# Patient Record
Sex: Female | Born: 1950 | Race: Black or African American
Health system: Midwestern US, Community
[De-identification: ages and names within clinical notes are randomized; demographics above are authoritative.]

## PROBLEM LIST (undated history)

## (undated) DIAGNOSIS — F209 Schizophrenia, unspecified: Secondary | ICD-10-CM

## (undated) DIAGNOSIS — E119 Type 2 diabetes mellitus without complications: Secondary | ICD-10-CM

---

## 2009-06-07 LAB — SERUM DRUG SCREEN
Acetaminophen Level: <10 ug/mL (ref 10.0–30.0)
Ethanol Lvl: <10 mg/dL
Salicylate Lvl: <1 mg/dL (ref 0–29)
TCA Scrn: NOT DETECTED ng/mL (ref ?–300)

## 2009-06-07 LAB — CBC
Hematocrit: 35.4 % (ref 34.0–48.0)
Hemoglobin: 11.9 g/dL (ref 11.5–15.5)
MCH: 29.7 pg (ref 26.0–35.0)
MCHC: 33.7 % (ref 32.0–34.5)
MCV: 88.2 fL (ref 80.0–99.9)
MPV: 9.2 fL (ref 7.0–12.0)
Platelets: 194 E9/L (ref 130–450)
RBC: 4.01 E12/L (ref 3.50–5.50)
RDW: 12.7 fL (ref 11.5–15.0)
WBC: 6.9 E9/L (ref 4.5–11.5)

## 2009-06-07 LAB — BASIC METABOLIC PANEL
BUN: 10 mg/dL (ref 6–20)
CO2: 32 mmol/L — ABNORMAL HIGH (ref 20–31)
Calcium: 9.4 mg/dL (ref 8.6–10.5)
Chloride: 106 mmol/L (ref 99–109)
Creatinine: 0.8 mg/dL (ref 0.5–1.1)
Glucose: 118 mg/dL — ABNORMAL HIGH (ref 70–110)
Potassium: 3.7 mmol/L (ref 3.5–5.5)
Sodium: 142 mmol/L (ref 132–146)

## 2009-06-07 LAB — GFR CALCULATED: Gfr Calculated: >60 mL/min/{1.73_m2} (ref >=60)

## 2010-06-23 NOTE — ED Notes (Signed)
Pt given bagged lunch.

## 2010-06-23 NOTE — ED Notes (Signed)
ERMD did not want labs to be drawn unless pt was a TDO. I have reviewed discharge instructions with the patient.  The patient verbalized understanding. Respirations unlabored. Skin warm and dry. [facility] Bevely Palmer, Second responder with [employer]. No prescriptions.

## 2010-06-23 NOTE — ED Provider Notes (Signed)
HPI Comments: Pt was brought from the [facility] station for being confused. Was not seen by crisis at that time. Was brought her by police as an ECO, secondary to not answering some questions correctly. Crisis saw her here and decided she has no issues and is not psychotic and is okay       Patient is a 59 y.o. female presenting with mental health disorder. The history is provided by the patient.   Mental Health Problem   This is a recurrent problem. The current episode started less than 1 hour ago. The problem has not changed since onset. Associated symptoms include delusions. Mental status baseline is normal.         No past medical history on file.     No past surgical history on file.      No family history on file.     History   Social History   ??? Marital Status: Unknown     Spouse Name: N/A     Number of Children: N/A   ??? Years of Education: N/A   Occupational History   ??? Not on file.   Social History Main Topics   ??? Smoking status: Current Everyday Smoker   ??? Smokeless tobacco: Not on file   ??? Alcohol Use: Yes   ??? Drug Use: No   ??? Sexually Active:    Other Topics Concern   ??? Not on file   Social History Narrative   ??? No narrative on file                    ALLERGIES: Review of patient's allergies indicates no known allergies.      Review of Systems   All other systems reviewed and are negative.        Filed Vitals:    06/23/10 1709   BP: 129/82   Pulse: 89   Temp: 98.6 ??F (37 ??C)   Resp: 19   Height: 5\' 6"  (1.676 m)   Weight: 120 lb (54.432 kg)   SpO2: 100%              Physical Exam   Nursing note and vitals reviewed.  Constitutional: She is oriented to person, place, and time. She appears well-developed and well-nourished.   HENT:   Head: Normocephalic and atraumatic.   Right Ear: External ear normal.   Left Ear: External ear normal.   Mouth/Throat: Oropharynx is clear and moist. No oropharyngeal exudate.    Eyes: Conjunctivae and EOM are normal. Pupils are equal, round, and reactive to light. Right eye exhibits no discharge. Left eye exhibits no discharge. No scleral icterus.   Neck: Normal range of motion. No tracheal deviation present. No thyromegaly present.   Cardiovascular: Normal rate, regular rhythm and normal heart sounds.    No murmur heard.  Pulmonary/Chest: Effort normal and breath sounds normal. No respiratory distress. She has no wheezes. She has no rales. She exhibits no tenderness.   Abdominal: Soft. She exhibits no distension. No tenderness. She has no rebound and no guarding.   Musculoskeletal: Normal range of motion. She exhibits no edema and no tenderness.   Lymphadenopathy:     She has no cervical adenopathy.   Neurological: She is alert and oriented to person, place, and time. No cranial nerve deficit. Coordination normal.   Skin: Skin is warm. No erythema.   Psychiatric: She has a normal mood and affect. Her speech is normal and behavior is normal. Judgment and thought  content normal. Cognition and memory are normal.        MDM    Procedures

## 2010-06-23 NOTE — ED Notes (Signed)
Crisis here to evaluate pt.

## 2014-05-01 ENCOUNTER — Emergency Department (HOSPITAL_COMMUNITY)
Admission: EM | Admit: 2014-05-01 | Discharge: 2014-05-03 | Disposition: A | Discharge status: Home / Self Care | Payer: Self-pay | Attending: Emergency Medicine | Admitting: Emergency Medicine

## 2014-05-01 ENCOUNTER — Encounter (HOSPITAL_COMMUNITY): Payer: Self-pay | Admitting: Emergency Medicine

## 2014-05-01 DIAGNOSIS — Z720 Tobacco use: Secondary | ICD-10-CM | POA: Insufficient documentation

## 2014-05-01 DIAGNOSIS — F4325 Adjustment disorder with mixed disturbance of emotions and conduct: Secondary | ICD-10-CM | POA: Insufficient documentation

## 2014-05-01 HISTORY — DX: Schizophrenia, unspecified: F20.9

## 2014-05-01 LAB — CBC
HCT: 38.3 % (ref 36.0–46.0)
Hemoglobin: 12 g/dL (ref 12.0–15.0)
MCH: 27.1 pg (ref 26.0–34.0)
MCHC: 31.3 g/dL (ref 30.0–36.0)
MCV: 86.5 fL (ref 78.0–100.0)
PLATELETS: 213 10*3/uL (ref 150–400)
RBC: 4.43 MIL/uL (ref 3.87–5.11)
RDW: 13.9 % (ref 11.5–15.5)
WBC: 8.9 10*3/uL (ref 4.0–10.5)

## 2014-05-01 LAB — COMPREHENSIVE METABOLIC PANEL
ALK PHOS: 76 U/L (ref 39–117)
ALT: 13 U/L (ref 0–35)
AST: 13 U/L (ref 0–37)
Albumin: 3.8 g/dL (ref 3.5–5.2)
Anion gap: 14 (ref 5–15)
BILIRUBIN TOTAL: 0.3 mg/dL (ref 0.3–1.2)
BUN: 14 mg/dL (ref 6–23)
CHLORIDE: 104 meq/L (ref 96–112)
CO2: 27 meq/L (ref 19–32)
Calcium: 9.5 mg/dL (ref 8.4–10.5)
Creatinine, Ser: 0.83 mg/dL (ref 0.50–1.10)
GFR calc Af Amer: 85 mL/min — ABNORMAL LOW (ref >90)
GFR, EST NON AFRICAN AMERICAN: 73 mL/min — AB (ref >90)
Glucose, Bld: 81 mg/dL (ref 70–99)
POTASSIUM: 3.8 meq/L (ref 3.7–5.3)
SODIUM: 145 meq/L (ref 137–147)
Total Protein: 7.3 g/dL (ref 6.0–8.3)

## 2014-05-01 LAB — ETHANOL: Alcohol, Ethyl (B): <11 mg/dL (ref 0–11)

## 2014-05-01 LAB — SALICYLATE LEVEL: Salicylate Lvl: <2 mg/dL — ABNORMAL LOW (ref 2.8–20.0)

## 2014-05-01 LAB — ACETAMINOPHEN LEVEL: Acetaminophen (Tylenol), Serum: <15 ug/mL (ref 10–30)

## 2014-05-01 MED ORDER — STERILE WATER FOR INJECTION IJ SOLN
INTRAMUSCULAR | Status: AC
  Administered 2014-05-01: 21:00:00
  Filled 2014-05-01: qty 10

## 2014-05-01 MED ORDER — ZIPRASIDONE MESYLATE 20 MG IM SOLR
20.0000 mg | Freq: Once | INTRAMUSCULAR | Status: AC
  Administered 2014-05-01: 20 mg via INTRAMUSCULAR
  Filled 2014-05-01: qty 20

## 2014-05-01 NOTE — BH Assessment (Signed)
Reviewed notes prior to assessment. Per notes pt is medically cleared, and behaving bizarrely.   Requested cart be placed with pt for assessment. Per [employer]rystal RN, pt was given geodon and is too sedated for assessment at this time. Crystal RN will fax a copy of IVC paperwork to [phone], and alert TTS when pt is alert enough for assessment.   Clista BernhardtNancy Camrin Lapre, [facility] Triage Specialist 05/01/2014 11:31 PM
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
## 2014-05-01 NOTE — ED Notes (Signed)
Pt verbally and physically abusive towards staff. Pt is yelling and threatening staff, security and [employer] officers. md informed. Charge nurse informed. Please refer to md orders.

## 2014-05-02 ENCOUNTER — Encounter (HOSPITAL_COMMUNITY): Payer: Self-pay | Admitting: *Deleted

## 2014-05-02 LAB — URINALYSIS, ROUTINE W REFLEX MICROSCOPIC
Bilirubin Urine: NEGATIVE
Glucose, UA: NEGATIVE mg/dL
Hgb urine dipstick: NEGATIVE
KETONES UR: NEGATIVE mg/dL
LEUKOCYTES UA: NEGATIVE
Nitrite: NEGATIVE
Protein, ur: NEGATIVE mg/dL
Specific Gravity, Urine: 1.008 (ref 1.005–1.030)
Urobilinogen, UA: 0.2 mg/dL (ref 0.0–1.0)
pH: 5.5 (ref 5.0–8.0)

## 2014-05-02 LAB — RAPID URINE DRUG SCREEN, HOSP PERFORMED
Amphetamines: NOT DETECTED
BARBITURATES: NOT DETECTED
Benzodiazepines: NOT DETECTED
Cocaine: NOT DETECTED
Opiates: NOT DETECTED
Tetrahydrocannabinol: NOT DETECTED

## 2014-05-02 MED ORDER — OLANZAPINE 5 MG PO TBDP: 5.0000 mg | ORAL_TABLET | ORAL | Status: DC

## 2014-05-02 MED ORDER — LORAZEPAM 2 MG/ML IJ SOLN: 2.0000 mg | Freq: Once | INTRAMUSCULAR | Status: AC | PRN

## 2014-05-02 MED ORDER — OLANZAPINE 5 MG PO TBDP: 5.0000 mg | ORAL_TABLET | Freq: Every day | ORAL | Status: DC | PRN

## 2014-05-02 MED ORDER — LORAZEPAM 1 MG PO TABS: 1.0000 mg | ORAL_TABLET | Freq: Once | ORAL | Status: AC | PRN

## 2014-05-02 NOTE — ED Notes (Signed)
Pt talking to someone although there is no one else in the room.
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
## 2014-05-02 NOTE — ED Notes (Signed)
Patient is irritable and anxious.  Reports she is "transiant" moving from [place] then to [place] and is coming her to get the "mega bus'. Refusing medications. Fluids offered. No physical complaints.

## 2014-05-02 NOTE — ED Notes (Signed)
Writer went in to get patient vitals and patient verbally cursed out Clinical research associatewriter and said " i am tired of your ass coming in here getting vitals" I had explained to patient I have not been here all day I had just changed shift with other techs about 2 hours ago. Patient told me to leave her alone. RN Rashell is aware of patient refusing her vitals and the conversation between Clinical research associatewriter and patient.
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
## 2014-05-02 NOTE — ED Notes (Signed)
Pt once again took her lunch tray and threw it in the room and refused to eat anything else.
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
## 2014-05-02 NOTE — ED Notes (Signed)
Pt belongings placed in locker 27 

## 2014-05-02 NOTE — Progress Notes (Signed)
Pt remains under IVC. Patient recommended for inpatient placement.   Byrd HesselbachKristen Zayyan Mullen, LCSW [phone]  ED CSW 05/02/2014 1459pm

## 2014-05-03 ENCOUNTER — Encounter (HOSPITAL_COMMUNITY): Payer: Self-pay | Admitting: Registered Nurse

## 2014-05-03 DIAGNOSIS — F4325 Adjustment disorder with mixed disturbance of emotions and conduct: Secondary | ICD-10-CM

## 2014-05-03 DIAGNOSIS — F29 Unspecified psychosis not due to a substance or known physiological condition: Secondary | ICD-10-CM

## 2014-05-03 NOTE — Consult Note (Signed)
Face to face evaluation and I agree with this note 

## 2014-05-03 NOTE — ED Notes (Signed)
Writer went to take vitals and patient said she does not want anybody to touch her and she told me to get out of her room

## 2014-05-03 NOTE — [facility] Suicide Risk Assessment (Cosign Needed)
Suicide Risk Assessment  Discharge Assessment     Demographic Factors:  Female  Total Time spent with patient: 30 minutes  Psychiatric Specialty Exam:     Blood pressure 118/63, pulse 102, temperature 98.4 F (36.9 C), temperature source Oral, resp. rate 17, SpO2 98.00%.There is no height or weight on file to calculate BMI.   General Appearance: Casual   Eye Contact:: Good   Speech: Clear and Coherent   Volume: Normal   Mood: "I feel fine"   Affect: Appropriate   Thought Process: Coherent   Orientation: Full (Time, Place, and Person)   Thought Content: WDL   Suicidal Thoughts: No   Homicidal Thoughts: No   Memory: Immediate; Good  Recent; Good  Remote; Fair   Judgement: Impaired   Insight: Fair and Present   Psychomotor Activity: Normal   Concentration: Good   Recall: Good   Fund of Knowledge:Fair   Language: Good   Akathisia: No   Handed: Right   AIMS (if indicated):   Assets: Communication Skills  Desire for Improvement   Sleep:   Musculoskeletal:  Strength & Muscle Tone: within normal limits  Gait & Station: wobble  Patient leans: N/A   Mental Status Per Nursing Assessment::   On Admission:     Current Mental Status by Physician: Patient denies suicidal/homicidal ideation, psychosis, and paranoia  Loss Factors: NA  Historical Factors: NA  Risk Reduction Factors:   Religious beliefs about death  Continued Clinical Symptoms:  None noted  Cognitive Features That Contribute To Risk:  None noted    Suicide Risk:  Minimal: No identifiable suicidal ideation.  Patients presenting with no risk factors but with morbid ruminations; may be classified as minimal risk based on the severity of the depressive symptoms  Discharge Diagnoses:   AXIS I:  Adjustment Disorder with Mixed Disturbance of Emotions and Conduct AXIS II:  Deferred AXIS III:   Past Medical History  Diagnosis Date  . Schizophrenia    AXIS IV:  other psychosocial or environmental  problems and problems related to social environment AXIS V:  61-70 mild symptoms  Plan Of Care/Follow-up recommendations:  Activity:  as tolerated Diet:  as tolerated  Is patient on multiple antipsychotic therapies at discharge:  No   Has Patient had three or more failed trials of antipsychotic monotherapy by history:  No  Recommended Plan for Multiple Antipsychotic Therapies: NA    Rankin, Shuvon, FNP-BC 05/03/2014, 10:51 AM

## 2014-05-03 NOTE — ED Notes (Signed)
Patient discharged with bus pass.  She is denying any thoughts of suicide, thoughts of hurting others, or hallucinations.  She states she wants to get to the bus terminal [facility] that [facility].  She left the unit ambulatory and all belongings were returned to her.  She was also given a bus pass.

## 2014-05-03 NOTE — Discharge Instructions (Signed)
Adjustment Disorder °Most changes in life can cause stress. Getting used to changes may take a few months or longer. If feelings of stress, hopelessness, or worry continue, you may have an adjustment disorder. This stress-related mental health problem may affect your feelings, thinking and how you act. It occurs in both sexes and happens at any age. °SYMPTOMS  °Some of the following problems may be seen and vary from person to person: °· Sadness or depression. °· Loss of enjoyment. °· Thoughts of suicide. °· Fighting. °· Avoiding family and friends. °· Poor school performance. °· Hopelessness, sense of loss. °· Trouble sleeping. °· Vandalism. °· Worry, weight loss or gain. °· Crying spells. °· Anxiety °· Reckless driving. °· Skipping school. °· Poor work performance. °· Nervousness. °· Ignoring bills. °· Poor attitude. °DIAGNOSIS  °Your caregiver will ask what has happened in your life and do a physical exam. They will make a diagnosis of an adjustment disorder when they are sure another problem or medical illness causing your feelings does not exist. °TREATMENT  °When problems caused by stress interfere with you daily life or last longer than a few months, you may need counseling for an adjustment disorder. Early treatment may diminish problems and help you to better cope with the stressful events in your life. Sometimes medication is necessary. Individual counseling and or support groups can be very helpful. °PROGNOSIS  °Adjustment disorders usually last less than 3 to 6 months. The condition may persist if there is long lasting stress. This could include health problems, relationship problems, or job difficulties where you can not easily escape from what is causing the problem. °PREVENTION  °Even the most mentally healthy, highly functioning people can suffer from an adjustment disorder given a significant blow from a life-changing event. There is no way to prevent pain and loss. Most people need help from time  to time. You are not alone. °SEEK MEDICAL CARE IF:  °Your feelings or symptoms listed above do not improve or worsen. °Document Released: 03/10/2006 Document Revised: 09/28/2011 Document Reviewed: 06/01/2007 °ExitCare® Patient Information ©2015 [employer]. This information is not intended to replace advice given to you by your health care provider. Make sure you discuss any questions you have with your health care provider. ° °

## 2014-05-03 NOTE — B[facility] Counselor (Signed)
Dr Ladona Ridgelaylor rescinded pt's IVC. Commitment change paperwork placed in [facility] IVC log and copy placed in pt's chart. Pt given bus pass.   Evette Cristalaroline Paige Denilson Salminen, [place] Assessment Counselor
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
## 2014-05-03 NOTE — ED Notes (Signed)
After patient completed shower she came to nurses station and stated " I am Michelle FrayGloria Pennington AKA Michelle GrandchildMelissa Pennington and I am not Michelle Pennington and [place] don't belong [place]". Patient then returned to her room and laid down. Safety maintain.

## 2016-10-24 ENCOUNTER — Emergency Department
Admission: EM | Admit: 2016-10-24 | Discharge: 2016-10-24 | Disposition: A | Discharge status: Home / Self Care | Payer: Medicare Other | Attending: Emergency Medicine | Admitting: Emergency Medicine

## 2016-10-24 ENCOUNTER — Emergency Department: Payer: Medicare Other

## 2016-10-24 ENCOUNTER — Other Ambulatory Visit: Payer: Self-pay

## 2016-10-24 ENCOUNTER — Encounter: Payer: Self-pay | Admitting: Emergency Medicine

## 2016-10-24 DIAGNOSIS — E119 Type 2 diabetes mellitus without complications: Secondary | ICD-10-CM | POA: Insufficient documentation

## 2016-10-24 DIAGNOSIS — F209 Schizophrenia, unspecified: Secondary | ICD-10-CM | POA: Diagnosis not present

## 2016-10-24 DIAGNOSIS — R05 Cough: Secondary | ICD-10-CM | POA: Diagnosis present

## 2016-10-24 DIAGNOSIS — J45901 Unspecified asthma with (acute) exacerbation: Secondary | ICD-10-CM | POA: Diagnosis not present

## 2016-10-24 DIAGNOSIS — Z5181 Encounter for therapeutic drug level monitoring: Secondary | ICD-10-CM | POA: Insufficient documentation

## 2016-10-24 DIAGNOSIS — F17210 Nicotine dependence, cigarettes, uncomplicated: Secondary | ICD-10-CM | POA: Insufficient documentation

## 2016-10-24 HISTORY — DX: Type 2 diabetes mellitus without complications: E11.9

## 2016-10-24 LAB — COMPREHENSIVE METABOLIC PANEL
ALK PHOS: 78 U/L (ref 38–126)
ALT: 18 U/L (ref 14–54)
AST: 20 U/L (ref 15–41)
Albumin: 3.8 g/dL (ref 3.5–5.0)
Anion gap: 6 (ref 5–15)
BILIRUBIN TOTAL: 0.3 mg/dL (ref 0.3–1.2)
BUN: 21 mg/dL — ABNORMAL HIGH (ref 6–20)
CHLORIDE: 108 mmol/L (ref 101–111)
CO2: 29 mmol/L (ref 22–32)
Calcium: 9.2 mg/dL (ref 8.9–10.3)
Creatinine, Ser: 0.88 mg/dL (ref 0.44–1.00)
GFR calc non Af Amer: >60 mL/min (ref >60)
GLUCOSE: 114 mg/dL — AB (ref 65–99)
Potassium: 3.8 mmol/L (ref 3.5–5.1)
Sodium: 143 mmol/L (ref 135–145)
Total Protein: 7.3 g/dL (ref 6.5–8.1)

## 2016-10-24 LAB — CBC
HEMATOCRIT: 38.2 % (ref 35.0–47.0)
Hemoglobin: 12.1 g/dL (ref 12.0–16.0)
MCH: 26.9 pg (ref 26.0–34.0)
MCHC: 31.8 g/dL — ABNORMAL LOW (ref 32.0–36.0)
MCV: 84.8 fL (ref 80.0–100.0)
Platelets: 243 10*3/uL (ref 150–440)
RBC: 4.5 MIL/uL (ref 3.80–5.20)
RDW: 14.5 % (ref 11.5–14.5)
WBC: 7.1 10*3/uL (ref 3.6–11.0)

## 2016-10-24 LAB — GLUCOSE, CAPILLARY: Glucose-Capillary: 149 mg/dL — ABNORMAL HIGH (ref 65–99)

## 2016-10-24 LAB — SALICYLATE LEVEL: Salicylate Lvl: <7 mg/dL (ref 2.8–30.0)

## 2016-10-24 LAB — URINE DRUG SCREEN, QUALITATIVE (ARMC ONLY)
Amphetamines, Ur Screen: NOT DETECTED
BENZODIAZEPINE, UR SCRN: NOT DETECTED
Barbiturates, Ur Screen: NOT DETECTED
CANNABINOID 50 NG, UR ~~LOC~~: NOT DETECTED
Cocaine Metabolite,Ur ~~LOC~~: NOT DETECTED
MDMA (Ecstasy)Ur Screen: NOT DETECTED
Methadone Scn, Ur: NOT DETECTED
Opiate, Ur Screen: NOT DETECTED
Phencyclidine (PCP) Ur S: NOT DETECTED
Tricyclic, Ur Screen: NOT DETECTED

## 2016-10-24 LAB — TROPONIN I: Troponin I: <0.03 ng/mL (ref <0.03)

## 2016-10-24 LAB — ACETAMINOPHEN LEVEL

## 2016-10-24 LAB — ETHANOL: Alcohol, Ethyl (B): <5 mg/dL (ref <5)

## 2016-10-24 MED ORDER — ALBUTEROL SULFATE (2.5 MG/3ML) 0.083% IN NEBU: 3.0000 mL | INHALATION_SOLUTION | RESPIRATORY_TRACT | Status: DC | PRN

## 2016-10-24 MED ORDER — IPRATROPIUM-ALBUTEROL 0.5-2.5 (3) MG/3ML IN SOLN
3.0000 mL | Freq: Once | RESPIRATORY_TRACT | Status: AC
  Administered 2016-10-24: 3 mL via RESPIRATORY_TRACT

## 2016-10-24 MED ORDER — IPRATROPIUM-ALBUTEROL 0.5-2.5 (3) MG/3ML IN SOLN
RESPIRATORY_TRACT | Status: AC
  Administered 2016-10-24: 3 mL via RESPIRATORY_TRACT
  Filled 2016-10-24: qty 6

## 2016-10-24 MED ORDER — METHYLPREDNISOLONE SODIUM SUCC 125 MG IJ SOLR
125.0000 mg | Freq: Once | INTRAMUSCULAR | Status: AC
  Administered 2016-10-24: 125 mg via INTRAVENOUS

## 2016-10-24 MED ORDER — IPRATROPIUM-ALBUTEROL 0.5-2.5 (3) MG/3ML IN SOLN
RESPIRATORY_TRACT | Status: AC
  Administered 2016-10-24: 3 mL via RESPIRATORY_TRACT
  Filled 2016-10-24: qty 3

## 2016-10-24 MED ORDER — METHYLPREDNISOLONE SODIUM SUCC 125 MG IJ SOLR
INTRAMUSCULAR | Status: AC
  Administered 2016-10-24: 125 mg via INTRAVENOUS
  Filled 2016-10-24: qty 2

## 2016-10-24 MED ORDER — PREDNISONE 20 MG PO TABS
ORAL_TABLET | ORAL | Status: AC
  Filled 2016-10-24: qty 2

## 2016-10-24 MED ORDER — ALBUTEROL SULFATE HFA 108 (90 BASE) MCG/ACT IN AERS
2.0000 | INHALATION_SPRAY | RESPIRATORY_TRACT | Status: DC | PRN
  Filled 2016-10-24: qty 6.7

## 2016-10-24 NOTE — ED Notes (Signed)
Taxi Voucher to shelter at [facility] provided to patient.
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
## 2016-10-24 NOTE — ED Notes (Signed)
Roylene Reason RN to provide D/C. (Patient refusing female Charity fundraiser)

## 2016-10-24 NOTE — ED Notes (Signed)
Patient refusing Proventil MDI, states "that stuff is killing people in the hospitals in [place], I wont take it". Roylene Reason RN and this RN attempted to explain risks/benefits of medication. Patient continues to refuse.

## 2016-10-24 NOTE — ED Notes (Signed)
Pt reports feeling better after breathing treatments, wheezing noted to have improved

## 2016-10-24 NOTE — ED Triage Notes (Signed)
Pt to triage via WC, brought in by [employer] from [employer], per [employer] pt is homeless and has been living in [employer] parking lot.  Pt wheezing, stating asthma has worsened over last 2 days.  Pt also reports being depressed b/c she can't find a place to live, denies SI

## 2016-10-24 NOTE — ED Notes (Signed)
Introduced self to patient. Patient states "I dont want a female nurse".  Patient refuses to say anything else to this RN
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
## 2016-10-24 NOTE — ED Notes (Signed)
MD Manson Passey seeing patient.

## 2016-10-24 NOTE — ED Notes (Signed)
VOL/ PENDING SOC CONSULT  

## 2016-10-24 NOTE — ED Notes (Addendum)
Pt refusing proventil because it is not the "gray one." Pt states that they "took that stuff off the market in 1969 in [place] because it was harming people". Pt stated that "it takes all the good stuff out of your body."  This RN attempted to explain to the patient that this was the albuterol that MD had prescribed but just the generic that our hospital carries. Pt still refuses proventil inhaler.

## 2018-01-31 IMAGING — CR DG CHEST 2V
1 series · 2 of 2 positions shown · non-contrast
Comparison: None.

CLINICAL DATA: Wheezing with asthma over the last 2 days.

EXAM:
CHEST  2 VIEW

[Series 1: dg chest 2 view · 0.14mm/px · 2 of 2 slices shown]
[im 1/2]
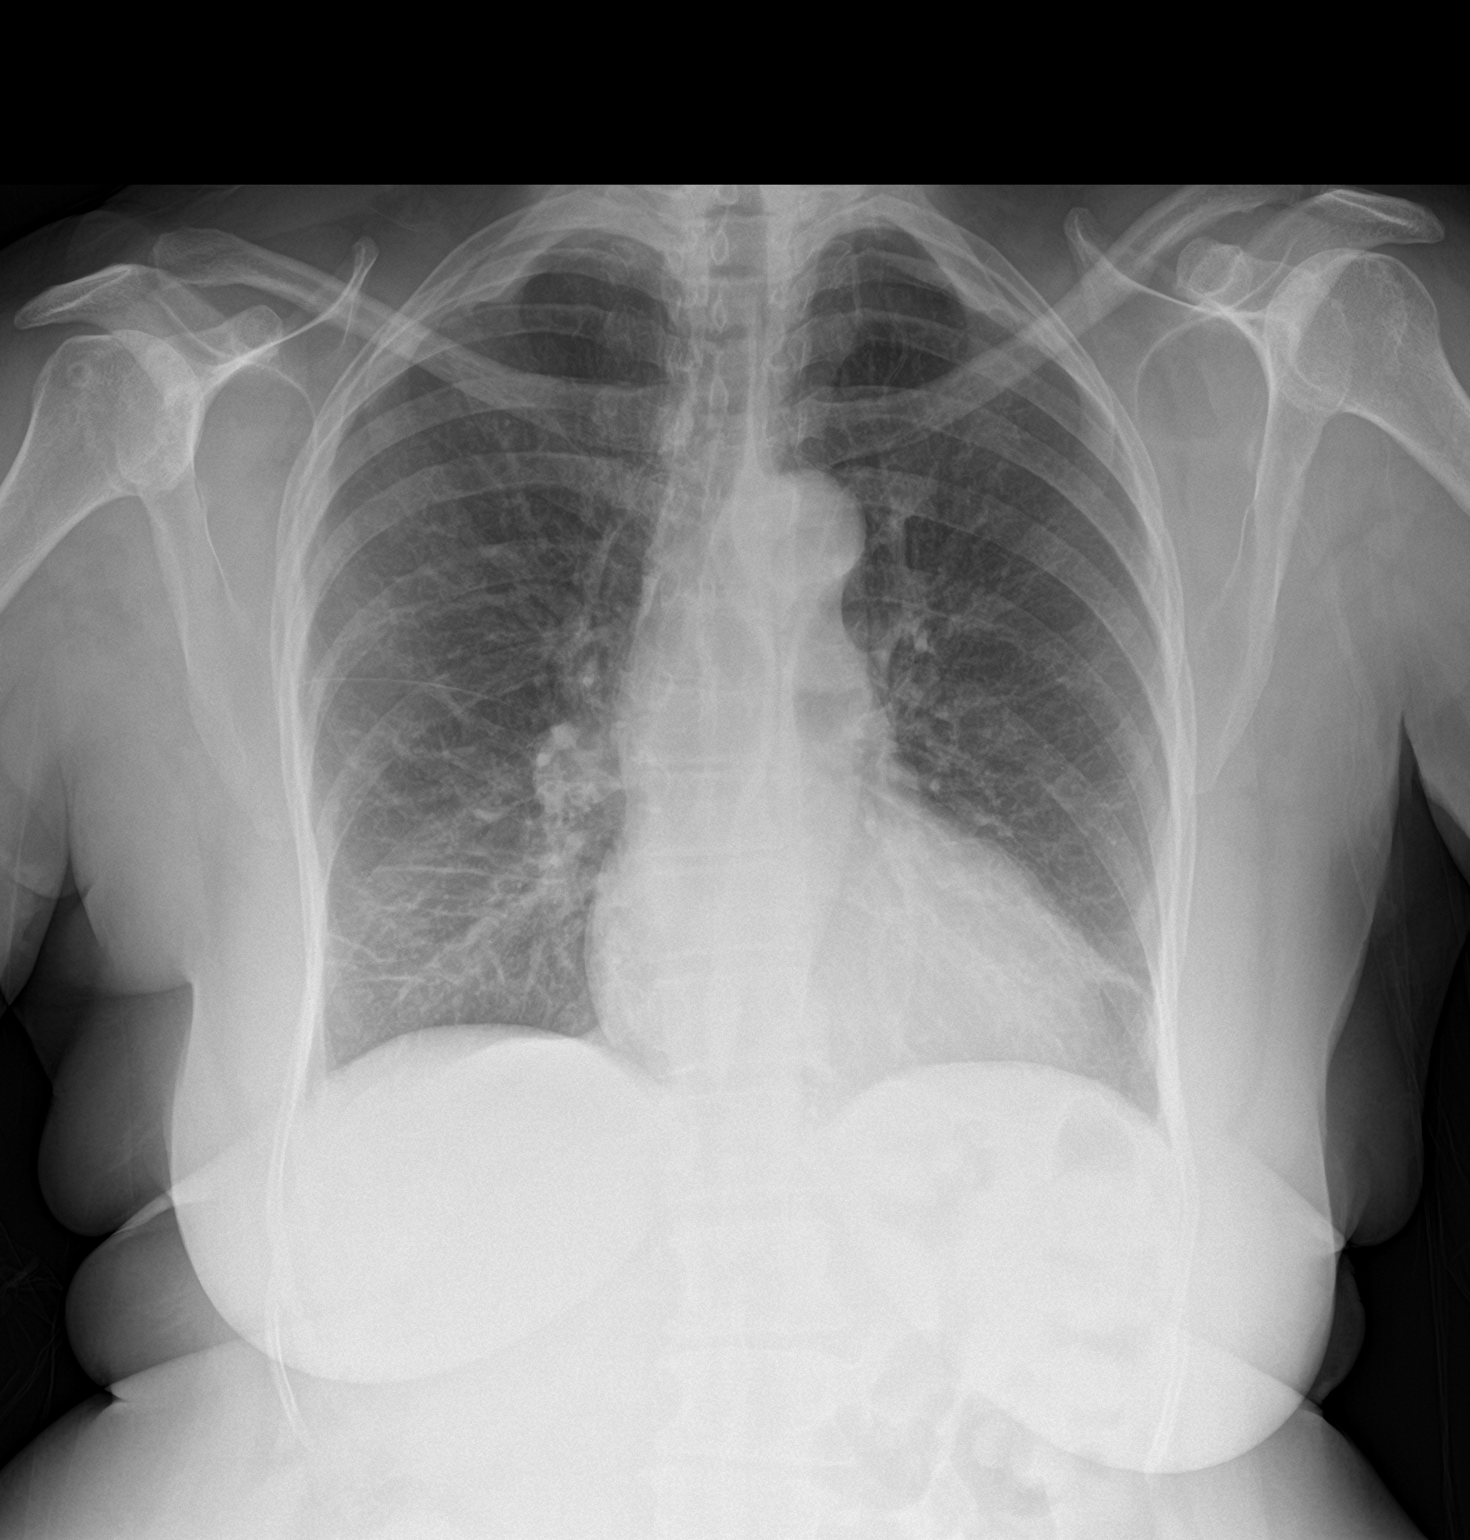
[im 2/2]
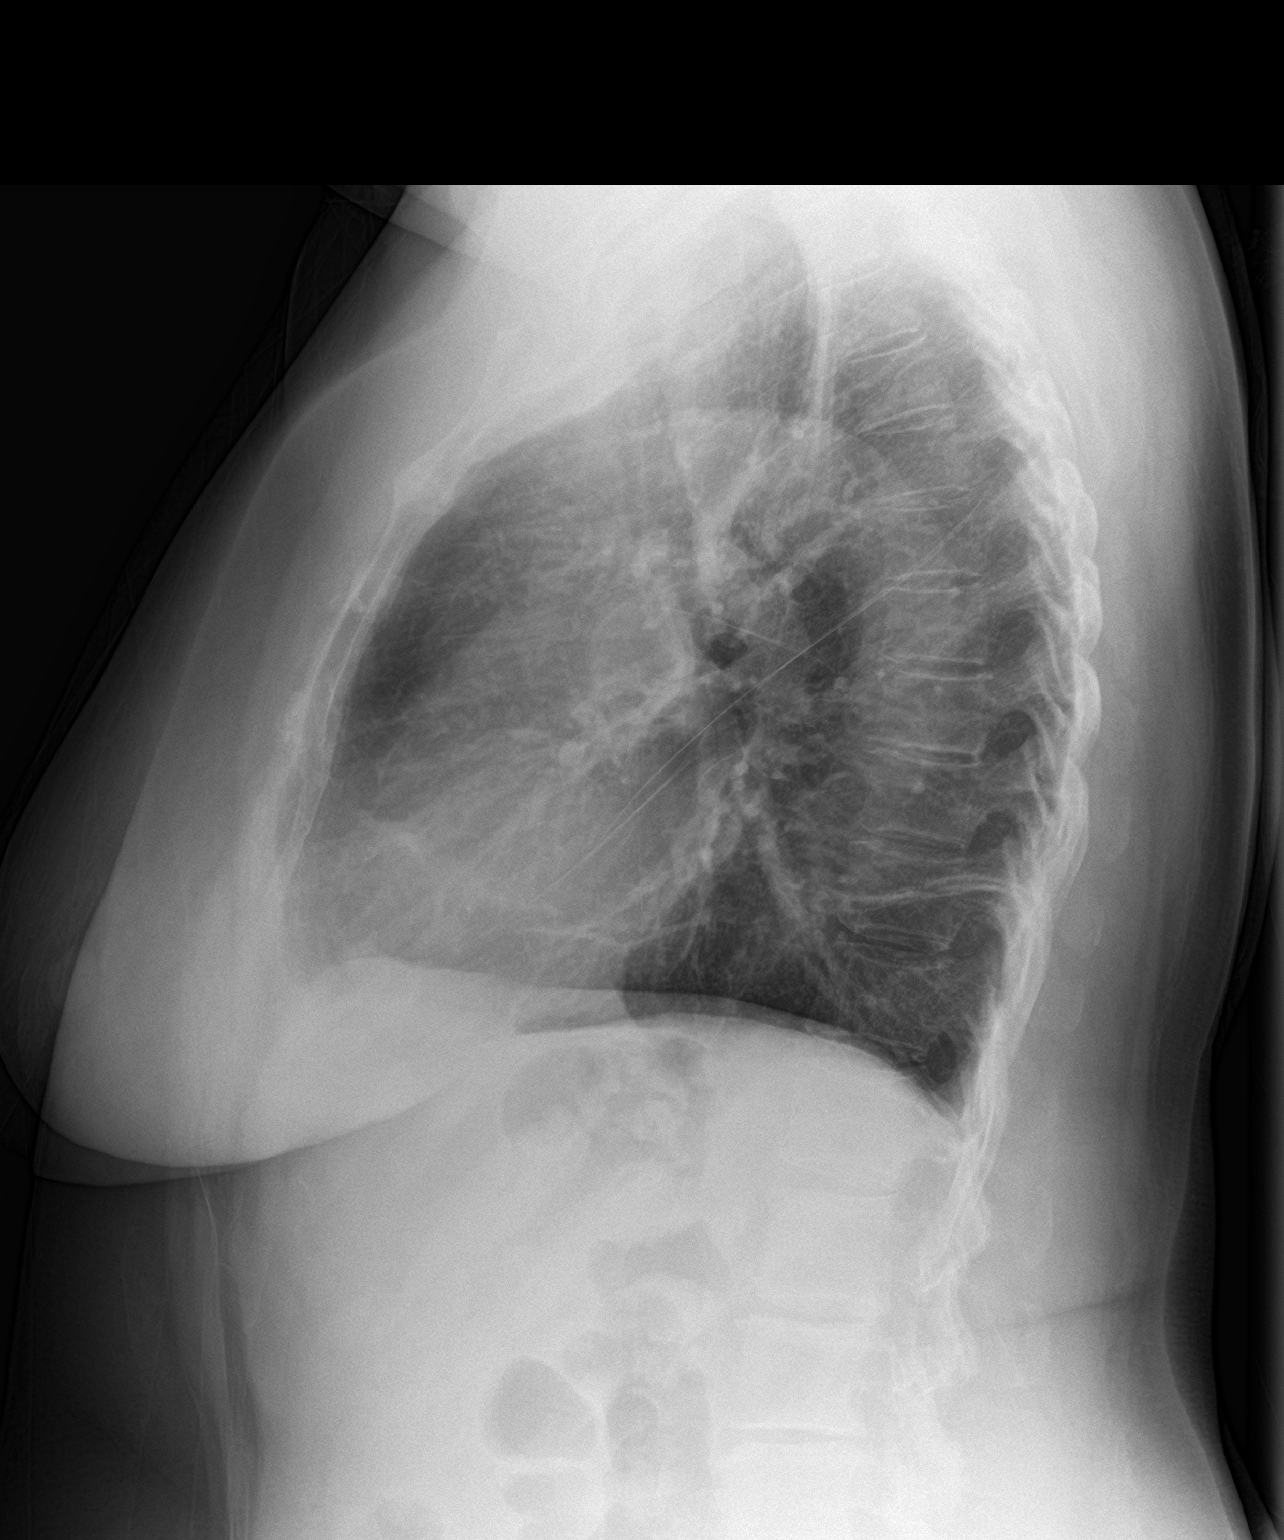

[2 of 2 positions shown; findings below may reference images not displayed]

FINDINGS: The heart size and mediastinal contours are within normal limits.
Mild aortic atherosclerosis without aneurysm. Mild hyperinflation of
the lungs, upper lobe predominant. Bibasilar scarring and/or
atelectasis is noted. The visualized skeletal structures are
unremarkable.
IMPRESSION: Hyperinflated appearance of the lungs, upper lobe predominant.
Bibasilar scarring and/or atelectasis.

## 2020-09-03 ENCOUNTER — Emergency Department: Admit: 2020-09-03 | Payer: BLUE CROSS/BLUE SHIELD

## 2020-09-03 ENCOUNTER — Inpatient Hospital Stay
Admit: 2020-09-03 | Discharge: 2020-09-03 | Disposition: A | Discharge status: Home / Self Care | Payer: BLUE CROSS/BLUE SHIELD | Attending: Emergency Medicine

## 2020-09-03 DIAGNOSIS — R509 Fever, unspecified: Secondary | ICD-10-CM

## 2020-09-03 LAB — CBC WITH AUTO DIFFERENTIAL
Basophils %: 0 % (ref 0–1)
Basophils Absolute: 0 10*3/uL (ref 0.0–0.1)
Eosinophils %: 2 % (ref 0–7)
Eosinophils Absolute: 0.1 10*3/uL (ref 0.0–0.4)
Granulocyte Absolute Count: 0 10*3/uL (ref 0.00–0.04)
Hematocrit: 32.2 % — ABNORMAL LOW (ref 35.0–47.0)
Hemoglobin: 9.8 g/dL — ABNORMAL LOW (ref 11.5–16.0)
Immature Granulocytes: 1 % — ABNORMAL HIGH (ref 0.0–0.5)
Lymphocytes %: 23 % (ref 12–49)
Lymphocytes Absolute: 1.6 10*3/uL (ref 0.8–3.5)
MCH: 27 PG (ref 26.0–34.0)
MCHC: 30.4 g/dL (ref 30.0–36.5)
MCV: 88.7 FL (ref 80.0–99.0)
MPV: 11.2 FL (ref 8.9–12.9)
Monocytes %: 9 % (ref 5–13)
Monocytes Absolute: 0.6 10*3/uL (ref 0.0–1.0)
NRBC Absolute: 0 10*3/uL (ref 0.00–0.01)
Neutrophils %: 65 % (ref 32–75)
Neutrophils Absolute: 4.4 10*3/uL (ref 1.8–8.0)
Nucleated RBCs: 0 PER 100 WBC
Platelets: 204 10*3/uL (ref 150–400)
RBC: 3.63 M/uL — ABNORMAL LOW (ref 3.80–5.20)
RDW: 13.7 % (ref 11.5–14.5)
WBC: 6.7 10*3/uL (ref 3.6–11.0)

## 2020-09-03 LAB — COMPREHENSIVE METABOLIC PANEL
ALT: 18 U/L (ref 12–78)
AST: 13 U/L — ABNORMAL LOW (ref 15–37)
Albumin/Globulin Ratio: 0.8 — ABNORMAL LOW (ref 1.1–2.2)
Albumin: 2.6 g/dL — ABNORMAL LOW (ref 3.5–5.0)
Alkaline Phosphatase: 70 U/L (ref 45–117)
Anion Gap: 3 mmol/L — ABNORMAL LOW (ref 5–15)
BUN: 16 MG/DL (ref 6–20)
Bun/Cre Ratio: 21 — ABNORMAL HIGH (ref 12–20)
CO2: 28 mmol/L (ref 21–32)
Calcium: 9 MG/DL (ref 8.5–10.1)
Chloride: 111 mmol/L — ABNORMAL HIGH (ref 97–108)
Creatinine: 0.77 MG/DL (ref 0.55–1.02)
EGFR IF NonAfrican American: >60 mL/min/{1.73_m2} (ref >60)
GFR African American: >60 mL/min/{1.73_m2} (ref >60)
Globulin: 3.3 g/dL (ref 2.0–4.0)
Glucose: 104 mg/dL — ABNORMAL HIGH (ref 65–100)
Potassium: 3.5 mmol/L (ref 3.5–5.1)
Sodium: 142 mmol/L (ref 136–145)
Total Bilirubin: 0.1 MG/DL — ABNORMAL LOW (ref 0.2–1.0)
Total Protein: 5.9 g/dL — ABNORMAL LOW (ref 6.4–8.2)

## 2020-09-03 LAB — COVID-19, RAPID: SARS-CoV-2, Rapid: NOT DETECTED

## 2020-09-03 LAB — LACTIC ACID
Lactic Acid: 1 MMOL/L (ref 0.4–2.0)
Lactic acid: 1 MMOL/L (ref 0.4–2.0)

## 2020-09-03 LAB — INFLUENZA A+B VIRAL AGS
Flu A Antigen: NEGATIVE
Influenza A Antigen: NEGATIVE
Influenza B Antigen: NEGATIVE
Influenza B Antigen: NEGATIVE

## 2020-09-03 LAB — METABOLIC PANEL, COMPREHENSIVE
A-G Ratio: 0.8 — ABNORMAL LOW (ref 1.1–2.2)
ALT (SGPT): 18 U/L (ref 12–78)
AST (SGOT): 13 U/L — ABNORMAL LOW (ref 15–37)
Albumin: 2.6 g/dL — ABNORMAL LOW (ref 3.5–5.0)
Alk. phosphatase: 70 U/L (ref 45–117)
Anion gap: 3 mmol/L — ABNORMAL LOW (ref 5–15)
BUN/Creatinine ratio: 21 — ABNORMAL HIGH (ref 12–20)
BUN: 16 MG/DL (ref 6–20)
Bilirubin, total: 0.1 MG/DL — ABNORMAL LOW (ref 0.2–1.0)
CO2: 28 mmol/L (ref 21–32)
Calcium: 9 MG/DL (ref 8.5–10.1)
Chloride: 111 mmol/L — ABNORMAL HIGH (ref 97–108)
Creatinine: 0.77 MG/DL (ref 0.55–1.02)
GFR est AA: >60 mL/min/{1.73_m2} (ref >60)
GFR est non-AA: >60 mL/min/{1.73_m2} (ref >60)
Globulin: 3.3 g/dL (ref 2.0–4.0)
Glucose: 104 mg/dL — ABNORMAL HIGH (ref 65–100)
Potassium: 3.5 mmol/L (ref 3.5–5.1)
Protein, total: 5.9 g/dL — ABNORMAL LOW (ref 6.4–8.2)
Sodium: 142 mmol/L (ref 136–145)

## 2020-09-03 LAB — CBC WITH AUTOMATED DIFF
ABS. BASOPHILS: 0 10*3/uL (ref 0.0–0.1)
ABS. EOSINOPHILS: 0.1 10*3/uL (ref 0.0–0.4)
ABS. IMM. GRANS.: 0 10*3/uL (ref 0.00–0.04)
ABS. LYMPHOCYTES: 1.6 10*3/uL (ref 0.8–3.5)
ABS. MONOCYTES: 0.6 10*3/uL (ref 0.0–1.0)
ABS. NEUTROPHILS: 4.4 10*3/uL (ref 1.8–8.0)
ABSOLUTE NRBC: 0 10*3/uL (ref 0.00–0.01)
BASOPHILS: 0 % (ref 0–1)
EOSINOPHILS: 2 % (ref 0–7)
HCT: 32.2 % — ABNORMAL LOW (ref 35.0–47.0)
HGB: 9.8 g/dL — ABNORMAL LOW (ref 11.5–16.0)
IMMATURE GRANULOCYTES: 1 % — ABNORMAL HIGH (ref 0.0–0.5)
LYMPHOCYTES: 23 % (ref 12–49)
MCH: 27 PG (ref 26.0–34.0)
MCHC: 30.4 g/dL (ref 30.0–36.5)
MCV: 88.7 FL (ref 80.0–99.0)
MONOCYTES: 9 % (ref 5–13)
MPV: 11.2 FL (ref 8.9–12.9)
NEUTROPHILS: 65 % (ref 32–75)
NRBC: 0 PER 100 WBC
PLATELET: 204 10*3/uL (ref 150–400)
RBC: 3.63 M/uL — ABNORMAL LOW (ref 3.80–5.20)
RDW: 13.7 % (ref 11.5–14.5)
WBC: 6.7 10*3/uL (ref 3.6–11.0)

## 2020-09-03 LAB — SAMPLES BEING HELD

## 2020-09-03 LAB — COVID-19 RAPID TEST: COVID-19 rapid test: NOT DETECTED

## 2020-09-03 MED ORDER — SODIUM CHLORIDE 0.9% BOLUS IV
0.9 % | Freq: Once | INTRAVENOUS | Status: AC
Start: 2020-09-03 — End: 2020-09-03
  Administered 2020-09-03: 09:00:00 via INTRAVENOUS

## 2020-09-03 MED ORDER — CEFUROXIME AXETIL 500 MG TAB
500 mg | ORAL_TABLET | Freq: Two times a day (BID) | ORAL | 0 refills | Status: AC
Start: 2020-09-03 — End: 2020-09-08

## 2020-09-03 MED ORDER — WATER FOR INJECTION, STERILE INJECTION
1 gram | Freq: Once | INTRAMUSCULAR | Status: AC
Start: 2020-09-03 — End: 2020-09-03
  Administered 2020-09-03: 10:00:00 via INTRAVENOUS

## 2020-09-03 MED ORDER — ACETAMINOPHEN 325 MG TABLET
325 mg | ORAL | Status: AC
Start: 2020-09-03 — End: 2020-09-03
  Administered 2020-09-03: 09:00:00 via ORAL

## 2020-09-03 MED FILL — CEFTRIAXONE 1 GRAM SOLUTION FOR INJECTION: 1 gram | INTRAMUSCULAR | Qty: 1

## 2020-09-03 MED FILL — SODIUM CHLORIDE 0.9 % IV: INTRAVENOUS | Qty: 1000

## 2020-09-03 MED FILL — ACETAMINOPHEN 325 MG TABLET: 325 mg | ORAL | Qty: 3

## 2020-09-03 NOTE — ED Notes (Signed)
 Attempted to place an IV on patient. Pt states do not touch me. Upon asking the pt what's wrong, pt states you're too black.
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
## 2020-09-03 NOTE — ED Notes (Signed)
Case management Inetta Fermo working with patient.

## 2020-09-03 NOTE — ED Notes (Signed)
Bedside and Verbal shift change report given to Barbara Peters, Charity fundraiser  (Cabin crew) by Wynonia Musty, RN (offgoing nurse). Report included the following information SBAR, Kardex, ED Summary, Procedure Summary and Intake/Output.

## 2020-09-03 NOTE — ED Notes (Signed)
Patient ambulated to restroom with cane. Clothes provided. Patient getting dressed. Food provided. Will alert case management when patient dressed and ready to go.
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
## 2020-09-03 NOTE — [place] Notes (Signed)
Call taken from lab, 1/2 blood cultures + for gram + cocci in clusters.  Chart reviewed, pt had temp of 100.9, normal WBC and lactic.  Demographics has no phone number and address listed is in [place].  Delorse Limber, PA  12:57 AM

## 2020-09-03 NOTE — ED Notes (Signed)
Patient provided with clothes and food. Getting dressed then will review DCI. Case management to help patient get to bus stop.
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
## 2020-09-03 NOTE — ED Notes (Signed)
0240- Pt given CHG bath, cleaned, and put into gown.     0312-Pt agitated, yelling at nurse and EMT. Pt refusing to have blood work done and refusing treatment. Dawna Part, MD aware.     1829- Pt agrees to blood work by MD.     9371 - Pt agrees to IV access. Unsuccessful attempt x 2.     0450- Pt refused to provide urine sample. Refuses straight cath. MD aware.

## 2020-09-08 LAB — CULTURE, BLOOD, PAIRED
Culture result:: NO GROWTH
Culture: NO GROWTH
